# Patient Record
Sex: Male | Born: 1975 | Race: Black or African American | Hispanic: No | Smoking: Current every day smoker
Health system: Southern US, Community
[De-identification: ages and names within clinical notes are randomized; demographics above are authoritative.]

---

## 2010-04-29 ENCOUNTER — Emergency Department (HOSPITAL_COMMUNITY): Admission: EM | Admit: 2010-04-29 | Discharge: 2010-04-29 | Payer: Self-pay | Admitting: Family Medicine

## 2010-04-30 ENCOUNTER — Emergency Department (HOSPITAL_COMMUNITY): Admission: EM | Admit: 2010-04-30 | Discharge: 2010-04-30 | Payer: Self-pay | Admitting: Emergency Medicine

## 2010-04-30 ENCOUNTER — Emergency Department (HOSPITAL_COMMUNITY): Admission: EM | Admit: 2010-04-30 | Discharge: 2010-04-30 | Payer: Self-pay | Admitting: Family Medicine

## 2014-08-10 ENCOUNTER — Emergency Department (HOSPITAL_COMMUNITY)
Admission: EM | Admit: 2014-08-10 | Discharge: 2014-08-10 | Disposition: A | Discharge status: Home / Self Care | Payer: Self-pay | Attending: Emergency Medicine | Admitting: Emergency Medicine

## 2014-08-10 ENCOUNTER — Encounter (HOSPITAL_COMMUNITY): Payer: Self-pay | Admitting: Emergency Medicine

## 2014-08-10 ENCOUNTER — Emergency Department (HOSPITAL_COMMUNITY): Payer: Self-pay

## 2014-08-10 DIAGNOSIS — R079 Chest pain, unspecified: Secondary | ICD-10-CM | POA: Insufficient documentation

## 2014-08-10 DIAGNOSIS — F17200 Nicotine dependence, unspecified, uncomplicated: Secondary | ICD-10-CM | POA: Insufficient documentation

## 2014-08-10 DIAGNOSIS — F1210 Cannabis abuse, uncomplicated: Secondary | ICD-10-CM | POA: Insufficient documentation

## 2014-08-10 DIAGNOSIS — F1410 Cocaine abuse, uncomplicated: Secondary | ICD-10-CM | POA: Insufficient documentation

## 2014-08-10 DIAGNOSIS — R0789 Other chest pain: Secondary | ICD-10-CM | POA: Insufficient documentation

## 2014-08-10 LAB — CBC
HCT: 41.4 % (ref 39.0–52.0)
Hemoglobin: 14.3 g/dL (ref 13.0–17.0)
MCH: 33.3 pg (ref 26.0–34.0)
MCHC: 34.5 g/dL (ref 30.0–36.0)
MCV: 96.5 fL (ref 78.0–100.0)
Platelets: 249 10*3/uL (ref 150–400)
RBC: 4.29 MIL/uL (ref 4.22–5.81)
RDW: 13 % (ref 11.5–15.5)
WBC: 12.1 10*3/uL — ABNORMAL HIGH (ref 4.0–10.5)

## 2014-08-10 LAB — COMPREHENSIVE METABOLIC PANEL
ALT: 17 U/L (ref 0–53)
AST: 24 U/L (ref 0–37)
Albumin: 3.8 g/dL (ref 3.5–5.2)
Alkaline Phosphatase: 63 U/L (ref 39–117)
Anion gap: 14 (ref 5–15)
BUN: 12 mg/dL (ref 6–23)
CO2: 27 meq/L (ref 19–32)
Calcium: 9.2 mg/dL (ref 8.4–10.5)
Chloride: 100 mEq/L (ref 96–112)
Creatinine, Ser: 0.83 mg/dL (ref 0.50–1.35)
GFR calc Af Amer: >90 mL/min (ref >90)
Glucose, Bld: 142 mg/dL — ABNORMAL HIGH (ref 70–99)
Potassium: 4.1 mEq/L (ref 3.7–5.3)
SODIUM: 141 meq/L (ref 137–147)
TOTAL PROTEIN: 7.1 g/dL (ref 6.0–8.3)
Total Bilirubin: 0.6 mg/dL (ref 0.3–1.2)

## 2014-08-10 LAB — RAPID URINE DRUG SCREEN, HOSP PERFORMED
AMPHETAMINES: NOT DETECTED
BARBITURATES: NOT DETECTED
BENZODIAZEPINES: NOT DETECTED
COCAINE: POSITIVE — AB
OPIATES: NOT DETECTED
TETRAHYDROCANNABINOL: POSITIVE — AB

## 2014-08-10 LAB — ETHANOL: Alcohol, Ethyl (B): <11 mg/dL (ref 0–11)

## 2014-08-10 LAB — TROPONIN I: Troponin I: <0.3 ng/mL (ref <0.30)

## 2014-08-10 NOTE — Discharge Instructions (Signed)
°Chest Pain (Nonspecific) °It is often hard to give a specific diagnosis for the cause of chest pain. There is always a chance that your pain could be related to something serious, such as a heart attack or a blood clot in the lungs. You need to follow up with your health care provider for further evaluation. °CAUSES  °· Heartburn. °· Pneumonia or bronchitis. °· Anxiety or stress. °· Inflammation around your heart (pericarditis) or lung (pleuritis or pleurisy). °· A blood clot in the lung. °· A collapsed lung (pneumothorax). It can develop suddenly on its own (spontaneous pneumothorax) or from trauma to the chest. °· Shingles infection (herpes zoster virus). °The chest wall is composed of bones, muscles, and cartilage. Any of these can be the source of the pain. °· The bones can be bruised by injury. °· The muscles or cartilage can be strained by coughing or overwork. °· The cartilage can be affected by inflammation and become sore (costochondritis). °DIAGNOSIS  °Lab tests or other studies may be needed to find the cause of your pain. Your health care provider may have you take a test called an ambulatory electrocardiogram (ECG). An ECG records your heartbeat patterns over a 24-hour period. You may also have other tests, such as: °· Transthoracic echocardiogram (TTE). During echocardiography, sound waves are used to evaluate how blood flows through your heart. °· Transesophageal echocardiogram (TEE). °· Cardiac monitoring. This allows your health care provider to monitor your heart rate and rhythm in real time. °· Holter monitor. This is a portable device that records your heartbeat and can help diagnose heart arrhythmias. It allows your health care provider to track your heart activity for several days, if needed. °· Stress tests by exercise or by giving medicine that makes the heart beat faster. °TREATMENT  °· Treatment depends on what may be causing your chest pain. Treatment may include: °¨ Acid blockers for  heartburn. °¨ Anti-inflammatory medicine. °¨ Pain medicine for inflammatory conditions. °¨ Antibiotics if an infection is present. °· You may be advised to change lifestyle habits. This includes stopping smoking and avoiding alcohol, caffeine, and chocolate. °· You may be advised to keep your head raised (elevated) when sleeping. This reduces the chance of acid going backward from your stomach into your esophagus. °Most of the time, nonspecific chest pain will improve within 2-3 days with rest and mild pain medicine.  °HOME CARE INSTRUCTIONS  °· If antibiotics were prescribed, take them as directed. Finish them even if you start to feel better. °· For the next few days, avoid physical activities that bring on chest pain. Continue physical activities as directed. °· Do not use any tobacco products, including cigarettes, chewing tobacco, or electronic cigarettes. °· Avoid drinking alcohol. °· Only take medicine as directed by your health care provider. °· Follow your health care provider's suggestions for further testing if your chest pain does not go away. °· Keep any follow-up appointments you made. If you do not go to an appointment, you could develop lasting (chronic) problems with pain. If there is any problem keeping an appointment, call to reschedule. °SEEK MEDICAL CARE IF:  °· Your chest pain does not go away, even after treatment. °· You have a rash with blisters on your chest. °· You have a fever. °SEEK IMMEDIATE MEDICAL CARE IF:  °· You have increased chest pain or pain that spreads to your arm, neck, jaw, back, or abdomen. °· You have shortness of breath. °· You have an increasing cough, or you cough   up blood.  You have severe back or abdominal pain.  You feel nauseous or vomit.  You have severe weakness.  You faint.  You have chills. This is an emergency. Do not wait to see if the pain will go away. Get medical help at once. Call your local emergency services (911 in [place] Do not drive  yourself to the hospital. MAKE SURE YOU:   Understand these instructions.  Will watch your condition.  Will get help right away if you are not doing well or get worse. Document Released: 08/10/2005 Document Revised: 11/05/2013 Document Reviewed: 06/05/2008 [facility] Patient Information 2015 [place], [place]. This information is not intended to replace advice given to you by your health care provider. Make sure you discuss any questions you have with your health care provider. Stimulant Use Disorder-Cocaine Cocaine is one of a group of powerful drugs called stimulants. Cocaine has medical uses for stopping nosebleeds and for pain control before minor nose or dental surgery. However, cocaine is misused because of the effects that it produces. These effects include:   A feeling of extreme pleasure.  Alertness.  High energy. Common street names for cocaine include coke, crack, blow, snow, and nose candy. Cocaine is snorted, dissolved in water and injected, or smoked.  Stimulants are addictive because they activate regions of the brain that produce both the pleasurable sensation of "reward" and psychological dependence. Together, these actions account for loss of control and the rapid development of drug dependence. This means you become ill without the drug (withdrawal) and need to keep using it to function.  Stimulant use disorder is use of stimulants that disrupts your daily life. It disrupts relationships with family and friends and how you do your job. Cocaine increases your blood pressure and heart rate. It can cause a heart attack or stroke. Cocaine can also cause death from irregular heart rate or seizures. SYMPTOMS Symptoms of stimulant use disorder with cocaine include:  Use of cocaine in larger amounts or over a longer period of time than intended.  Unsuccessful attempts to cut down or control cocaine use.  A lot of time spent obtaining, using, or recovering from the effects of  cocaine.  A strong desire or urge to use cocaine (craving).  Continued use of cocaine in spite of major problems at work, school, or home because of use.  Continued use of cocaine in spite of relationship problems because of use.  Giving up or cutting down on important life activities because of cocaine use.  Use of cocaine over and over in situations when it is physically hazardous, such as driving a car.  Continued use of cocaine in spite of a physical problem that is likely related to use. Physical problems can include:  Malnutrition.  Nosebleeds.  Chest pain.  High blood pressure.  A hole that develops between the part of your nose that separates your nostrils (perforated nasal septum).  Lung and kidney damage.  Continued use of cocaine in spite of a mental problem that is likely related to use. Mental problems can include:  Schizophrenia-like symptoms.  Depression.  Bipolar mood swings.  Anxiety.  Sleep problems.  Need to use more and more cocaine to get the same effect, or lessened effect over time with use of the same amount of cocaine (tolerance).  Having withdrawal symptoms when cocaine use is stopped, or using cocaine to reduce or avoid withdrawal symptoms. Withdrawal symptoms include:  Depressed or irritable mood.  Low energy or restlessness.  Bad dreams.  Poor or excessive sleep.  Increased appetite. DIAGNOSIS Stimulant use disorder is diagnosed by your health care provider. You may be asked questions about your cocaine use and how it affects your life. A physical exam may be done. A drug screen may be ordered. You may be referred to a mental health professional. The diagnosis of stimulant use disorder requires at least two symptoms within 12 months. The type of stimulant use disorder depends on the number of signs and symptoms you have. The type may be:  Mild. Two or three signs and symptoms.  Moderate. Four or five signs and symptoms.  Severe.  Six or more signs and symptoms. TREATMENT Treatment for stimulant use disorder is usually provided by mental health professionals with training in substance use disorders. The following options are available:  Counseling or talk therapy. Talk therapy addresses the reasons you use cocaine and ways to keep you from using again. Goals of talk therapy include:  Identifying and avoiding triggers for use.  Handling cravings.  Replacing use with healthy activities.  Support groups. Support groups provide emotional support, advice, and guidance.  Medicine. Certain medicines may decrease cocaine cravings or withdrawal symptoms. HOME CARE INSTRUCTIONS  Take medicines only as directed by your health care provider.  Identify the people and activities that trigger your cocaine use and avoid them.  Keep all follow-up visits as directed by your health care provider. SEEK MEDICAL CARE IF:  Your symptoms get worse or you relapse.  You are not able to take medicines as directed. SEEK IMMEDIATE MEDICAL CARE IF:  You have serious thoughts about hurting yourself or others.  You have a seizure, chest pain, sudden weakness, or loss of speech or vision. FOR MORE INFORMATION  National Institute on Drug Abuse: http://www.price-smith.com/  Substance Abuse and Mental Health Services Administration: SkateOasis.com.pt Document Released: 10/28/2000 Document Revised: 03/17/2014 Document Reviewed: 11/13/2013 [facility] Patient Information 2015 [place], [place]. This information is not intended to replace advice given to you by your health care provider. Make sure you discuss any questions you have with your health care provider.     Emergency Department Resource Guide 1) Find a Doctor and Pay Out of Pocket Although you won't have to find out who is covered by your insurance plan, it is a good idea to ask around and get recommendations. You will then need to call the office and see if the doctor you have chosen will  accept you as a new patient and what types of options they offer for patients who are self-pay. Some doctors offer discounts or will set up payment plans for their patients who do not have insurance, but you will need to ask so you aren't surprised when you get to your appointment.  2) Contact Your Local Health Department Not all health departments have doctors that can see patients for sick visits, but many do, so it is worth a call to see if yours does. If you don't know where your local health department is, you can check in your phone book. The CDC also has a tool to help you locate your state's health department, and many state websites also have listings of all of their local health departments.  3) Find a Walk-in Clinic If your illness is not likely to be very severe or complicated, you may want to try a walk in clinic. These are popping up all over the country in pharmacies, drugstores, and shopping centers. They're usually staffed by nurse practitioners or physician assistants that have been  trained to treat common illnesses and complaints. They're usually fairly quick and inexpensive. However, if you have serious medical issues or chronic medical problems, these are probably not your best option.  No Primary Care Doctor: - Call Health Connect at  [phone] - they can help you locate a primary care doctor that  accepts your insurance, provides certain services, etc. - Physician Referral Service- [phone]  Chronic Pain Problems: Organization         Address  Phone   Notes  [facility]  [phone] Patients need to be referred by their primary care doctor.   Medication Assistance: Organization         Address  Phone   Notes  [facility] Medication [facility] [address] [place] [place], [place] [address] [phone] --Must be a resident of [facility] -- Must have NO insurance coverage whatsoever (no Medicaid/ Medicare, etc.) -- The pt.  MUST have a primary care doctor that directs their care regularly and follows them in the community   [facility]ist  [phone]   [employer]  [phone]    Agencies that provide inexpensive medical care: Organization         Address  Phone   Notes  [facility]  [phone]   [facility]    [phone]   [facility] [address] [place], [place] [address] [phone]   [facility] [address] [place], [place] [phone]   Planned Parenthood    [phone]   [facility]    [phone]   [facility]  [address], [place] Phone:  [phone], Fax:  [phone] Hours of Operation:  9 am - 6 pm, M-F.  Also accepts Medicaid/Medicare and self-pay.  [facility]  [address], [place] Phone: [phone], Fax: [phone]. Hours of Operation:  8:30 am - 5:30 pm, M-F.  Also accepts Medicaid and self-pay.  [facility] [place] [address] [place] Phone: [phone]   [facility] [address] Natasha Bence [place], [place] [phone], Ext. [phone] Mondays & Thursdays: 7-9 AM.  First 15 patients are seen on a first come, first serve basis.    Medicaid-accepting [facility] Providers:  Organization         Address  Phone   Notes  [facility] [address],  [phone] Also accepts self-pay patients.  [facility] [address] Laurell Josephs [place], [place]  [phone]   [facility] [address], [place] [phone]   [facility] [address], [place] [phone]   Renaye Rakers [address], [place]   [phone] Only accepts [place] Access [place] patients after they have their name applied to their card.   Self-Pay (no insurance) in  [facility]:  Organization         Address  Phone   Notes  Sickle Cell Patients, [facility] Internal Medicine [address] [place], [place] [phone]   [facility] [address] [place], [place] [phone]   [facility] [place]  [address], [place] [phone]   [facility]/Dr. Osei-Bonsu  [address], [place] or [place]  [address] Laure[address] Joseph[address], [place] [phone] Phone number for both [facility] and [place] locations is the same.  [facility] [address], [place] [phone]   [facility] [address] [place] or [address] [phone]   [facility] [address], [place] [phone], phone; [phone], fax Sees patients 1st and 3rd Saturday of every month.  Must not qualify for public or private insurance (i.e. Medicaid, Medicare, [place] Health Choice, Veterans' Benefits)  Household income should be no more than 200% of the poverty level The clinic cannot treat you if you are pregnant or think you are pregnant  Sexually transmitted diseases are not treated at the clinic.    Dental Care: Organization         Address  Phone  Notes  [facility] [address] [place], [place] [phone] Accepts children up to age 63 who are enrolled in [place] or [place] Health Choice; pregnant women with a Medicaid card; and children who have applied for Medicaid or [place] Health Choice, but were declined, whose parents can pay a reduced fee at time of service.  [facility] Department of [facility]  [address], [place] [phone] Accepts children up to age 75 who are enrolled in [place] or [place] Health Choice; pregnant women with a Medicaid card; and children who have applied for Medicaid or [place] Health Choice, but were declined, whose  parents can pay a reduced fee at time of service.  [facility]  [address] [place], [place] [phone] Patients are seen by appointment only. Walk-ins are not accepted. [facility] will see patients 62 years of age and older. Monday - Tuesday (8am-5pm) Most Wednesdays (8:30-5pm) $30 per visit, cash only  [facility]  [address], [facility] [phone] Patients are seen by appointment only. Walk-ins are not accepted. [facility] will see patients 63 years of age and older. One Wednesday Evening (Monthly: Volunteer Based).  $30 per visit, cash only  [employer]  [phone] for adults; Children under age 67, call Graduate Pediatric Dentistry at [phone]. Children aged 65-14, please call [phone] to request a pediatric application.  Dental services are provided in all areas of dental care including fillings, crowns and bridges, complete and partial dentures, implants, gum treatment, root canals, and extractions. Preventive care is also provided. Treatment is provided to both adults and children. Patients are selected via a lottery and there is often a waiting list.   [facility] [address] [place]  [phone] [email or web address]   [facility] [address] [place], [place] [phone], Ext. [phone] Second and Fourth Thursday of each month, opens at 6:30 AM; Clinic ends at 9 AM.  Patients are seen on a first-come first-served basis, and a limited number are seen during each clinic.   [facility]  [address] Ether Griffins [place], [place] [phone]   Eligibility Requirements You must have lived in [place], [place], or [place] counties for at least the last three months.   You cannot be eligible for state or federal sponsored [place], including [employer], [place], or [employer].   You generally cannot be eligible for  healthcare insurance through your employer.    How to apply: Eligibility screenings are  held every Tuesday and Wednesday afternoon from 1:00 pm until 4:00 pm. You do not need an appointment for the interview!  [facility] [address], [place], [place] [phone]   [facility]  [phone]   [facility] Health Department  [phone]   [facility] Health Department  [phone]    Behavioral Health Resources in the Community: Intensive Outpatient Programs Organization         Address  Phone  Notes  [facility] Services [address] [place], [place] [phone]   [facility] Outpatient [address] [place], [place] [phone]   [facility] [address] [place], [place]  [phone]   [facility] Mental Health [address]  [place], [place] [phone] or [phone]   Substance Abuse Resources Organization         Address  Phone  Notes  [facility] Drug [facility]  [phone]   [facility]  [phone]   [facility] [place]  [phone]   Floydene Flock  [phone]   Residential & Outpatient Substance Abuse Program  [phone]   Psychological Services Organization         Address  Phone  Notes  [facility] Behavioral Health  [phone]   [facility] Services  [phone]   [facility] Mental Health [address], [place] [phone] or [phone]    Mobile Crisis Teams Organization         Address  Phone  Notes  [facility], Mobile Crisis Care Unit  [phone]   [facility]  [address] [place], [place] [phone]   Doristine Locks [address] [place] [phone]    Self-Help/Support Groups Organization         Address  Phone             Notes  [facility] [place] - variety of support groups  336- I7437963 Call for more information  Narcotics  Anonymous (NA), [facility] [address], [employer] [place]  2 meetings at this location   Statistician         Address  Phone  Notes  [facility] [address],    [place]  [phone]   [facility]  [address] [place] [address], [place], [place] [phone]   [facility] [address] [place], [place] [phone] Admissions: 8am-3pm M-F  [facility] [address],    [place], [place] [phone]   [facility] [address] [place], [place], [place] [phone]   [facility] [address]  [place], [place] [phone]   [facility] - Intensive Outpatient [address] Laurell Josephs [address], [place], [place] [phone]   [facility] [address] [place]  [place], [place] [phone] or [phone]   [facility] ([facility]) [address] [place], [place] [phone] Accepts Medicaid  [facility] [address]  [place] [phone] Substance Abuse/Addiction Treatment   [facility] Organization         Address  Phone  Notes  [facility]  [phone]   Angie Fava, PhD [address] Ervin Knack [place], [place]   [phone] or [phone]   [facility] Behavioral   [address] [place], [place] [phone]   [facility] [address] [place], [place] [phone] Insurance/Medicaid/sponsorship through  [facility]  [facility] [address]                                    [place], [place] [phone] Therapy/tele-psych/case  [facility] [address]   [place], [place] [phone]    Dr. Lolly Mustache  [phone]   [facility]  [employer] [facility] Dept. 1) [address], [place] 2) [address] [place] 3)  [address], [place] [phone]  [phone]   [facility] Child Abuse Hotline 9036521635 or [phone] (After Hours)
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
## 2014-08-10 NOTE — ED Provider Notes (Signed)
Medical screening examination/treatment/procedure(s) were performed by non-physician practitioner and as supervising physician I was immediately available for consultation/collaboration.   EKG Interpretation   Date/Time:  Sunday August 10 2014 08:05:28 EDT Ventricular Rate:  84 PR Interval:  141 QRS Duration: 99 QT Interval:  365 QTC Calculation: 431 R Axis:   36 Text Interpretation:  Sinus rhythm ST elev, probable normal early repol  pattern No prior for comparison Confirmed by Wilkie Aye  MD, Yaiden Yang ([id number])  on 08/10/2014 8:10:02 AM        Shon Baton, MD 08/10/14 1947

## 2014-08-10 NOTE — ED Notes (Signed)
Ordered pt. A breakfast tray.
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
## 2014-09-28 ENCOUNTER — Encounter (HOSPITAL_COMMUNITY): Payer: Self-pay | Admitting: Physical Medicine and Rehabilitation

## 2014-09-28 ENCOUNTER — Emergency Department (HOSPITAL_COMMUNITY): Payer: Self-pay

## 2014-09-28 ENCOUNTER — Emergency Department (HOSPITAL_COMMUNITY)
Admission: EM | Admit: 2014-09-28 | Discharge: 2014-09-28 | Disposition: A | Discharge status: Home / Self Care | Payer: Self-pay | Attending: Emergency Medicine | Admitting: Emergency Medicine

## 2014-09-28 DIAGNOSIS — J0100 Acute maxillary sinusitis, unspecified: Secondary | ICD-10-CM | POA: Insufficient documentation

## 2014-09-28 DIAGNOSIS — R000 Tachycardia, unspecified: Secondary | ICD-10-CM | POA: Insufficient documentation

## 2014-09-28 DIAGNOSIS — Z720 Tobacco use: Secondary | ICD-10-CM | POA: Insufficient documentation

## 2014-09-28 DIAGNOSIS — R109 Unspecified abdominal pain: Secondary | ICD-10-CM

## 2014-09-28 DIAGNOSIS — R319 Hematuria, unspecified: Secondary | ICD-10-CM

## 2014-09-28 DIAGNOSIS — N390 Urinary tract infection, site not specified: Secondary | ICD-10-CM | POA: Insufficient documentation

## 2014-09-28 DIAGNOSIS — Z87442 Personal history of urinary calculi: Secondary | ICD-10-CM | POA: Insufficient documentation

## 2014-09-28 LAB — COMPREHENSIVE METABOLIC PANEL
ALBUMIN: 4.1 g/dL (ref 3.5–5.2)
ALT: 33 U/L (ref 0–53)
ANION GAP: 19 — AB (ref 5–15)
AST: 47 U/L — ABNORMAL HIGH (ref 0–37)
Alkaline Phosphatase: 71 U/L (ref 39–117)
BUN: 16 mg/dL (ref 6–23)
CALCIUM: 9.6 mg/dL (ref 8.4–10.5)
CHLORIDE: 100 meq/L (ref 96–112)
CO2: 21 mEq/L (ref 19–32)
CREATININE: 1.03 mg/dL (ref 0.50–1.35)
GFR calc Af Amer: >90 mL/min (ref >90)
GFR, EST NON AFRICAN AMERICAN: 89 mL/min — AB (ref >90)
Glucose, Bld: 83 mg/dL (ref 70–99)
Potassium: 4.2 mEq/L (ref 3.7–5.3)
Sodium: 140 mEq/L (ref 137–147)
Total Bilirubin: 0.8 mg/dL (ref 0.3–1.2)
Total Protein: 7.5 g/dL (ref 6.0–8.3)

## 2014-09-28 LAB — URINE MICROSCOPIC-ADD ON

## 2014-09-28 LAB — CBC WITH DIFFERENTIAL/PLATELET
Basophils Absolute: 0 10*3/uL (ref 0.0–0.1)
Basophils Relative: 0 % (ref 0–1)
EOS PCT: 1 % (ref 0–5)
Eosinophils Absolute: 0.1 10*3/uL (ref 0.0–0.7)
HCT: 42.3 % (ref 39.0–52.0)
HEMOGLOBIN: 14.4 g/dL (ref 13.0–17.0)
Lymphocytes Relative: 16 % (ref 12–46)
Lymphs Abs: 1.7 10*3/uL (ref 0.7–4.0)
MCH: 33.3 pg (ref 26.0–34.0)
MCHC: 34 g/dL (ref 30.0–36.0)
MCV: 97.7 fL (ref 78.0–100.0)
MONOS PCT: 7 % (ref 3–12)
Monocytes Absolute: 0.8 10*3/uL (ref 0.1–1.0)
NEUTROS ABS: 8.5 10*3/uL — AB (ref 1.7–7.7)
Neutrophils Relative %: 77 % (ref 43–77)
Platelets: 280 10*3/uL (ref 150–400)
RBC: 4.33 MIL/uL (ref 4.22–5.81)
RDW: 13 % (ref 11.5–15.5)
WBC: 11.1 10*3/uL — ABNORMAL HIGH (ref 4.0–10.5)

## 2014-09-28 LAB — URINALYSIS, ROUTINE W REFLEX MICROSCOPIC
GLUCOSE, UA: NEGATIVE mg/dL
Ketones, ur: 40 mg/dL — AB
Nitrite: POSITIVE — AB
PH: 5.5 (ref 5.0–8.0)
PROTEIN: 100 mg/dL — AB
Specific Gravity, Urine: 1.031 — ABNORMAL HIGH (ref 1.005–1.030)
Urobilinogen, UA: 1 mg/dL (ref 0.0–1.0)

## 2014-09-28 MED ORDER — PHENAZOPYRIDINE HCL 200 MG PO TABS: 200.0000 mg | ORAL_TABLET | Freq: Three times a day (TID) | ORAL | Status: AC

## 2014-09-28 MED ORDER — HYDROMORPHONE HCL 1 MG/ML IJ SOLN
1.0000 mg | Freq: Once | INTRAMUSCULAR | Status: AC
  Administered 2014-09-28: 1 mg via INTRAVENOUS
  Filled 2014-09-28: qty 1

## 2014-09-28 MED ORDER — HYDROCODONE-ACETAMINOPHEN 5-325 MG PO TABS: 1.0000 | ORAL_TABLET | Freq: Four times a day (QID) | ORAL | Status: AC | PRN

## 2014-09-28 MED ORDER — CEPHALEXIN 500 MG PO CAPS: 500.0000 mg | ORAL_CAPSULE | Freq: Four times a day (QID) | ORAL | Status: AC

## 2014-09-28 MED ORDER — DEXTROSE 5 % IV SOLN
1.0000 g | Freq: Once | INTRAVENOUS | Status: AC
  Administered 2014-09-28: 1 g via INTRAVENOUS
  Filled 2014-09-28: qty 10

## 2014-09-28 NOTE — ED Provider Notes (Signed)
CSN: [id number]     Arrival date & time 09/28/14  1442 History   First MD Initiated Contact with Patient 09/28/14 1606     Chief Complaint  Patient presents with  . Abdominal Pain  . Hematuria  . Dysuria     (Consider location/radiation/quality/duration/timing/severity/associated sxs/prior Treatment) HPI Comments: Patient with history of kidney stone, presents to the emergency department with chief complaints of hematuria, dysuria, and lower abdominal pain. Patient states the pain started several hours ago. He states that he was urinating when he began to notice pain.  Patient states that he tried to stop urinating, but then had blood spurting from his penis.  He states that the sight of the blood caused him to pass out.  He denies any head injury.  He denies any history of the same.  He states that his pain is 9/10.  He denies any associated symptoms.  The history is provided by the patient. No language interpreter was used.    History reviewed. No pertinent past medical history. History reviewed. No pertinent past surgical history. No family history on file. History  Substance Use Topics  . Smoking status: Current Every Day Smoker    Types: Cigarettes  . Smokeless tobacco: Not on file  . Alcohol Use: Yes    Review of Systems  Constitutional: Negative for fever and chills.  Respiratory: Negative for shortness of breath.   Cardiovascular: Negative for chest pain.  Gastrointestinal: Positive for abdominal pain. Negative for nausea, vomiting, diarrhea and constipation.  Genitourinary: Positive for dysuria and hematuria.  All other systems reviewed and are negative.     Allergies  Review of patient's allergies indicates no known allergies.  Home Medications   Prior to Admission medications   Not on File   BP 115/67 mmHg  Pulse 117  Temp(Src) 98.1 F (36.7 C) (Oral)  Resp 20  SpO2 99% Physical Exam  Constitutional: He is oriented to person, place, and time. He  appears well-developed and well-nourished.  HENT:  Head: Normocephalic and atraumatic.  Right Ear: External ear normal.  Left Ear: External ear normal.  Mouth/Throat: Oropharynx is clear and moist. No oropharyngeal exudate.  Swollen, erythematous turbinates, maxillary sinuses tender to palpation  Eyes: Conjunctivae and EOM are normal. Pupils are equal, round, and reactive to light.  Neck: Normal range of motion. Neck supple.  Cardiovascular: Regular rhythm and normal heart sounds.   tachycardic  Pulmonary/Chest: Effort normal and breath sounds normal. No respiratory distress. He has no wheezes. He has no rales. He exhibits no tenderness.  Abdominal: Soft. Bowel sounds are normal.  Genitourinary:  Circumcised male, some new blood near the tip of penis, no active bleeding, minimal tenderness on palpation of testes, no masses, lesions, or other abnormality  Rectal exam: prostate not enlarged, non-tender  Chaperone present  Musculoskeletal: Normal range of motion.  Neurological: He is alert and oriented to person, place, and time.  Skin: Skin is warm and dry.  Psychiatric: He has a normal mood and affect. His behavior is normal. Judgment and thought content normal.  Nursing note and vitals reviewed.   ED Course  Procedures (including critical care time) Labs Review Labs Reviewed  URINALYSIS, ROUTINE W REFLEX MICROSCOPIC - Abnormal; Notable for the following:    Color, Urine RED (*)    APPearance TURBID (*)    Specific Gravity, Urine 1.031 (*)    Hgb urine dipstick LARGE (*)    Bilirubin Urine MODERATE (*)    Ketones, ur 40 (*)  Protein, ur 100 (*)    Nitrite POSITIVE (*)    Leukocytes, UA MODERATE (*)    All other components within normal limits  CBC WITH DIFFERENTIAL - Abnormal; Notable for the following:    WBC 11.1 (*)    Neutro Abs 8.5 (*)    All other components within normal limits  COMPREHENSIVE METABOLIC PANEL - Abnormal; Notable for the following:    AST 47 (*)     GFR calc non Af Amer 89 (*)    Anion gap 19 (*)    All other components within normal limits  URINE MICROSCOPIC-ADD ON    Imaging Review No results found.   EKG Interpretation None      MDM   Final diagnoses:  Hematuria  Flank pain  UTI (lower urinary tract infection)    Patient with hematuria, dysuria, and flank pain.  Suspect KS.    CT is reassuring.  Discussed patient with Dr. Anitra LauthPlunkett.  Patient states that he presses on the posterior aspect of his scrotum after urinating and this is when the blood comes.  Consider prostatitis.  I will check prostate.  If negative, plan for discharge with abx and urology follow-up.  Prostate nontender. Doubt prostatitis. Will discharge with Keflex, will culture urine, recommend urology follow-up. Patient understands and agrees with the plan. He is stable and ready for discharge.    Roxy Horsemanobert Adda Stokes, PA-C 09/28/14 [id number]  Gwyneth SproutWhitney Plunkett, MD 09/28/14 [phone]

## 2014-09-28 NOTE — ED Notes (Signed)
Pt presents to department for evaluation of lower abdominal pain, hematuria and dysuria. Onset today. Pt states history of kidney stones. 8/10 pain at the time. Pt is alert and oriented x4. NAD.

## 2014-09-28 NOTE — ED Notes (Signed)
Patient returned from CT

## 2014-09-28 NOTE — Discharge Instructions (Signed)

## 2014-09-28 NOTE — ED Notes (Signed)
Patient transported to CT 

## 2014-09-30 LAB — URINE CULTURE
Colony Count: NO GROWTH
Culture: NO GROWTH

## 2015-09-16 IMAGING — CT CT ABD-PELV W/O CM
2 of 4 series · 16 of 46 positions shown, 18 images · non-contrast
Comparison: None.

CLINICAL DATA: 38-year-old male with abdominal and pelvic pain,
hematuria and dysuria. History of urinary calculi.

EXAM:
CT ABDOMEN AND PELVIS WITHOUT CONTRAST
TECHNIQUE: Multidetector CT imaging of the abdomen and pelvis was performed
following the standard protocol without IV contrast.

[Series 2: abd/ pelvis 5.0 i30f 1 · axial · 0.72mm/px · z∈[-1195,-800]mm · 13 of 87 slices shown, 15 images]
[im 4/87  soft-tissue]
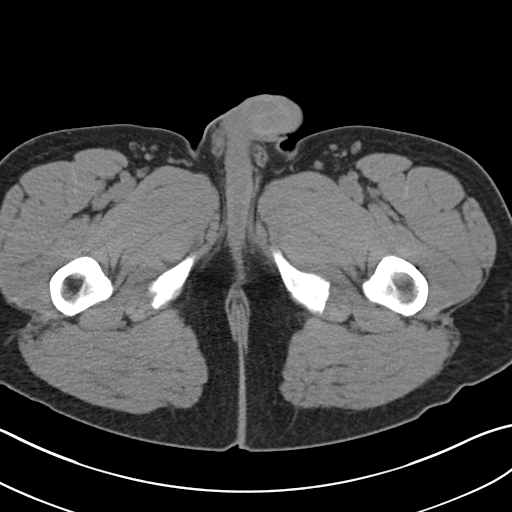
[im 4/87  bone]
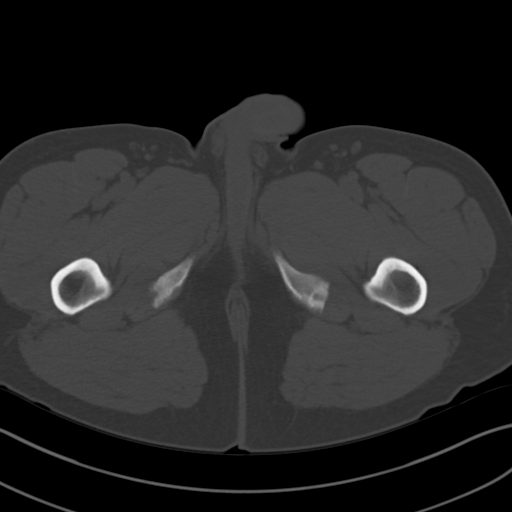
[im 11/87  soft-tissue]
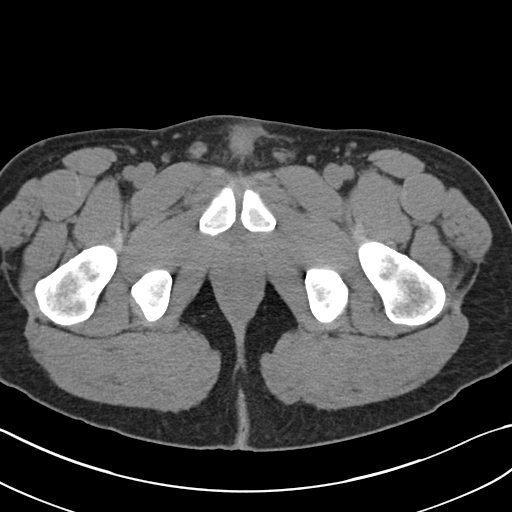
[im 18/87  soft-tissue]
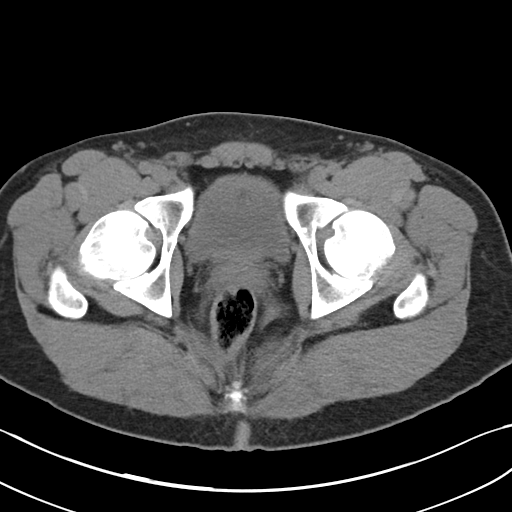
[im 25/87  soft-tissue]
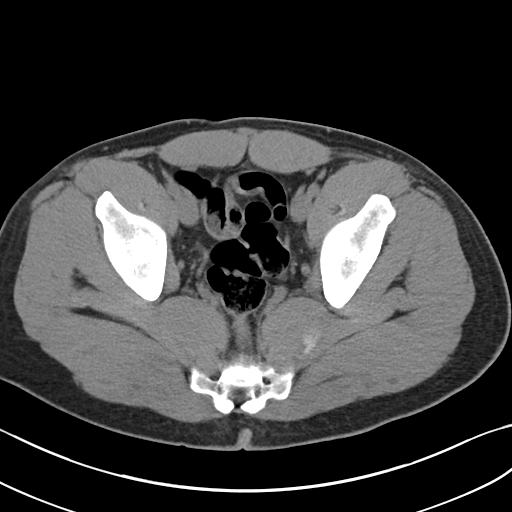
[im 31/87  soft-tissue]
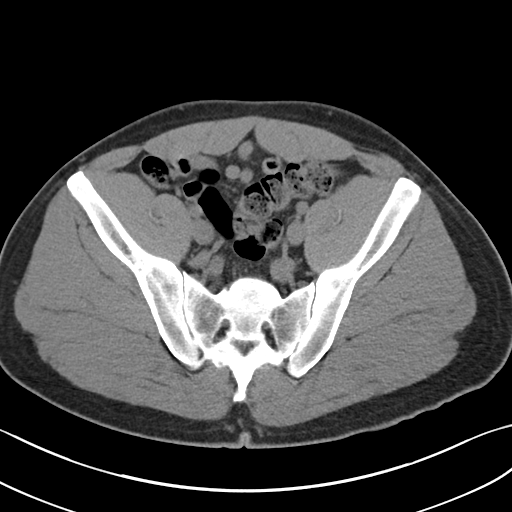
[im 38/87  soft-tissue]
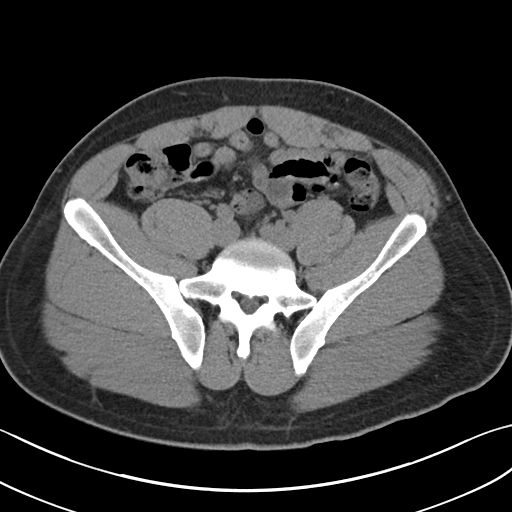
[im 45/87  soft-tissue]
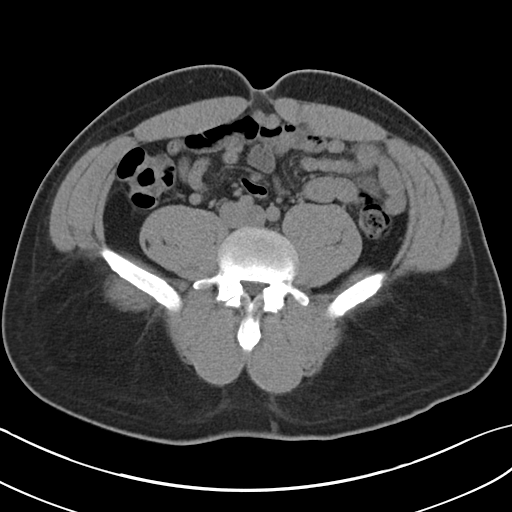
[im 49/87  soft-tissue]
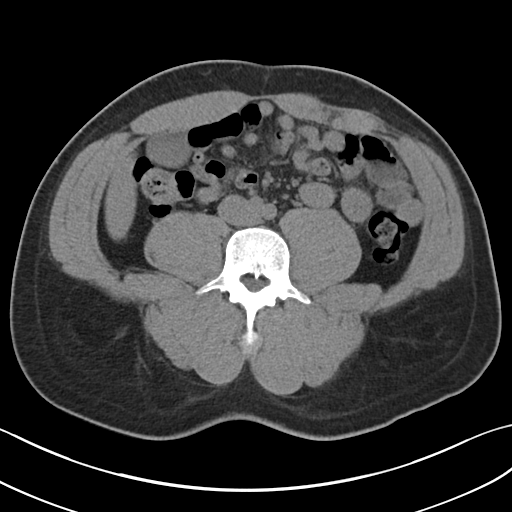
[im 56/87  soft-tissue]
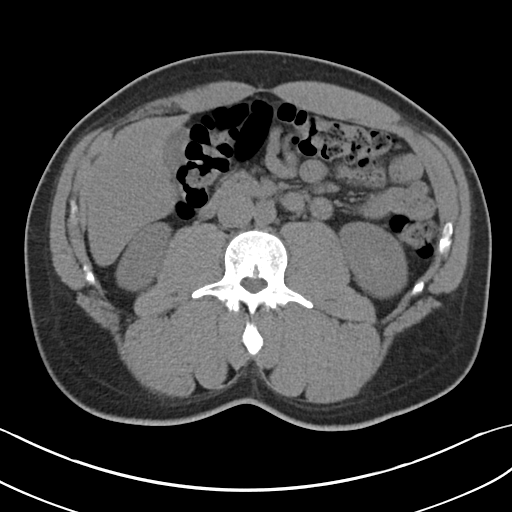
[im 56/87  bone]
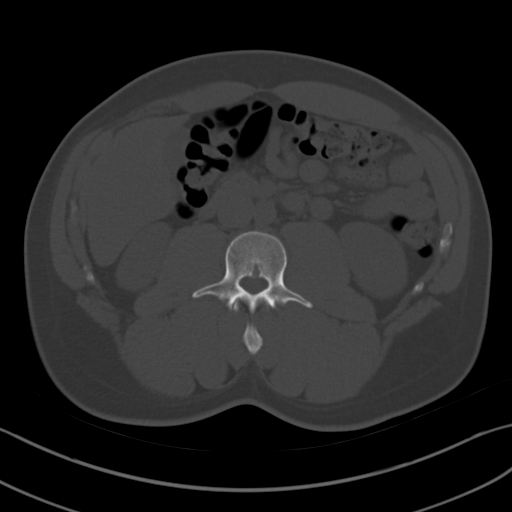
[im 62/87  soft-tissue]
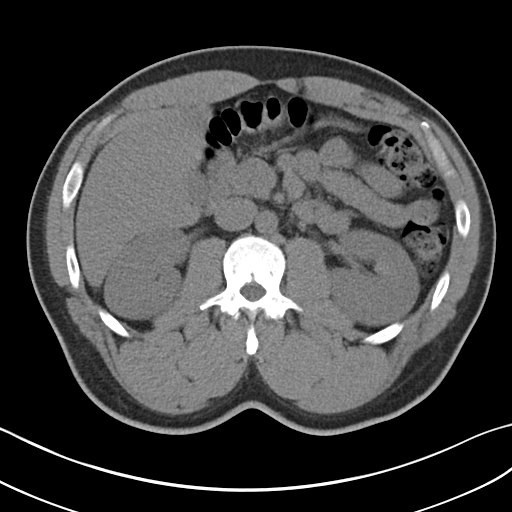
[im 69/87  soft-tissue]
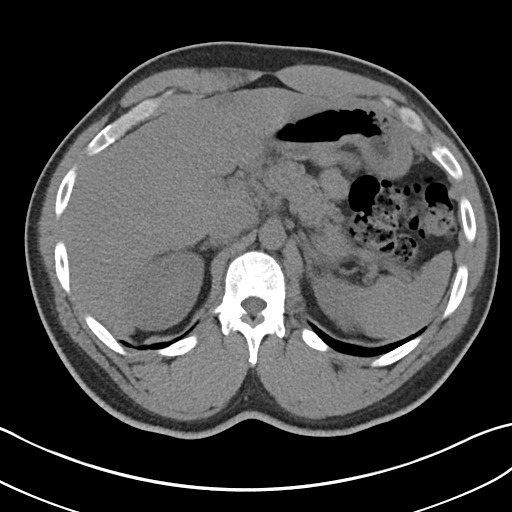
[im 76/87  soft-tissue]
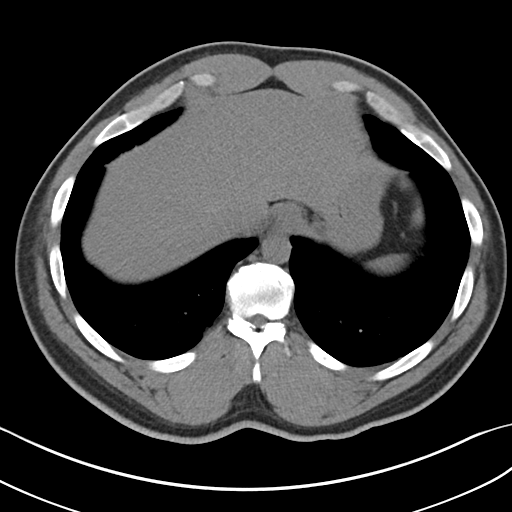
[im 83/87  soft-tissue]
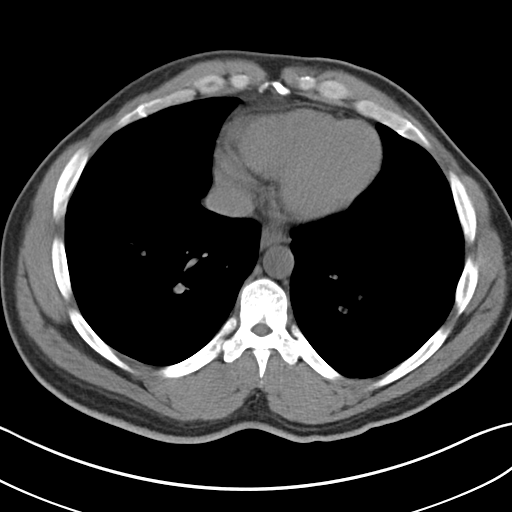

[Series 5: coronals · coronal · 0.70mm/px · 3 of 127 slices shown]
[im 43/127  soft-tissue]
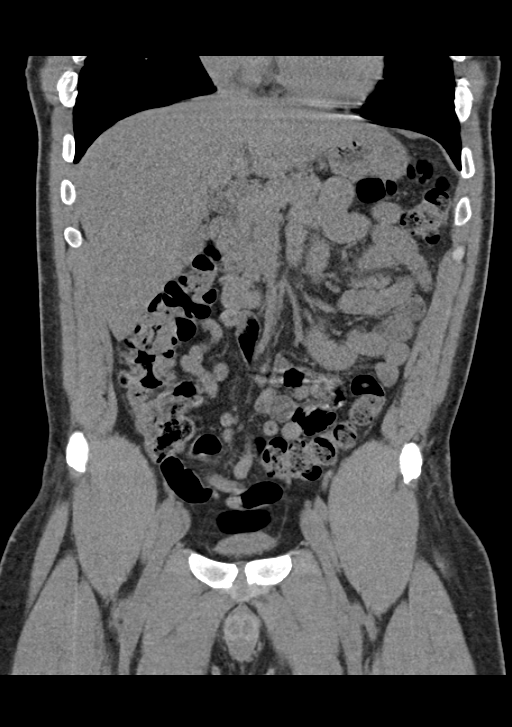
[im 57/127  soft-tissue]
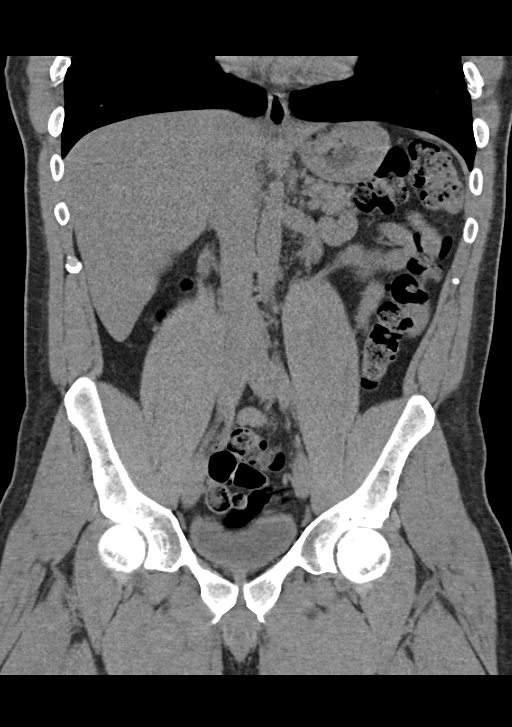
[im 71/127  soft-tissue]
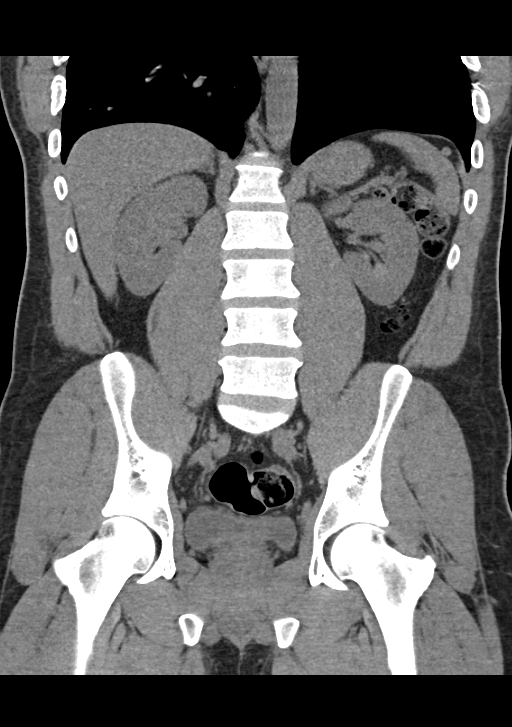

[16 of 46 positions shown; findings below may reference images not displayed]

FINDINGS: The liver, spleen, pancreas, gallbladder, adrenal glands and kidneys
are unremarkable.

There is no evidence of hydronephrosis or urinary calculi.

Please note that parenchymal abnormalities may be missed without
intravenous contrast.

There is no evidence of free fluid, enlarged lymph nodes, biliary
dilation or abdominal aortic aneurysm.

The bowel, bladder and appendix are unremarkable. There is no
evidence of bowel obstruction, abscess or pneumoperitoneum.

No acute or suspicious bony abnormalities are identified.
IMPRESSION: Unremarkable noncontrast CT of the abdomen and pelvis.
